# Patient Record
Sex: Male | Born: 2000 | Race: Black or African American | Hispanic: No | Marital: Single | Smoking: Current every day smoker
Health system: Southern US, Community
[De-identification: ages and names within clinical notes are randomized; demographics above are authoritative.]

## PROBLEM LIST (undated history)

## (undated) DIAGNOSIS — F32A Depression, unspecified: Secondary | ICD-10-CM

## (undated) DIAGNOSIS — F329 Major depressive disorder, single episode, unspecified: Secondary | ICD-10-CM

## (undated) DIAGNOSIS — F319 Bipolar disorder, unspecified: Secondary | ICD-10-CM

## (undated) HISTORY — PX: TONSILLECTOMY: SUR1361

---
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
## 2019-02-17 ENCOUNTER — Encounter (HOSPITAL_COMMUNITY): Payer: Self-pay

## 2019-02-17 ENCOUNTER — Emergency Department (HOSPITAL_COMMUNITY)
Admission: EM | Admit: 2019-02-17 | Discharge: 2019-02-17 | Disposition: A | Discharge status: Home / Self Care | Payer: Medicaid - Out of State | Attending: Emergency Medicine | Admitting: Emergency Medicine

## 2019-02-17 ENCOUNTER — Other Ambulatory Visit: Payer: Self-pay

## 2019-02-17 ENCOUNTER — Emergency Department (HOSPITAL_COMMUNITY): Payer: Medicaid - Out of State

## 2019-02-17 DIAGNOSIS — Z20822 Contact with and (suspected) exposure to covid-19: Secondary | ICD-10-CM | POA: Diagnosis not present

## 2019-02-17 DIAGNOSIS — Z0184 Encounter for antibody response examination: Secondary | ICD-10-CM | POA: Diagnosis not present

## 2019-02-17 DIAGNOSIS — R6883 Chills (without fever): Secondary | ICD-10-CM | POA: Diagnosis present

## 2019-02-17 DIAGNOSIS — R509 Fever, unspecified: Secondary | ICD-10-CM | POA: Insufficient documentation

## 2019-02-17 HISTORY — DX: Depression, unspecified: F32.A

## 2019-02-17 HISTORY — DX: Bipolar disorder, unspecified: F31.9

## 2019-02-17 LAB — URINALYSIS, ROUTINE W REFLEX MICROSCOPIC
Bilirubin Urine: NEGATIVE
Glucose, UA: NEGATIVE mg/dL
Hgb urine dipstick: NEGATIVE
Ketones, ur: NEGATIVE mg/dL
Leukocytes,Ua: NEGATIVE
Nitrite: NEGATIVE
Protein, ur: NEGATIVE mg/dL
Specific Gravity, Urine: 1.023 (ref 1.005–1.030)
pH: 8 (ref 5.0–8.0)

## 2019-02-17 LAB — COMPREHENSIVE METABOLIC PANEL
ALT: 22 U/L (ref 0–44)
AST: 20 U/L (ref 15–41)
Albumin: 4.3 g/dL (ref 3.5–5.0)
Alkaline Phosphatase: 67 U/L (ref 38–126)
Anion gap: 10 (ref 5–15)
BUN: 11 mg/dL (ref 6–20)
CO2: 24 mmol/L (ref 22–32)
Calcium: 9.1 mg/dL (ref 8.9–10.3)
Chloride: 104 mmol/L (ref 98–111)
Creatinine, Ser: 0.92 mg/dL (ref 0.61–1.24)
GFR calc Af Amer: >60 mL/min (ref >60)
GFR calc non Af Amer: >60 mL/min (ref >60)
Glucose, Bld: 99 mg/dL (ref 70–99)
Potassium: 3.4 mmol/L — ABNORMAL LOW (ref 3.5–5.1)
Sodium: 138 mmol/L (ref 135–145)
Total Bilirubin: 1.3 mg/dL — ABNORMAL HIGH (ref 0.3–1.2)
Total Protein: 7.2 g/dL (ref 6.5–8.1)

## 2019-02-17 LAB — CBC WITH DIFFERENTIAL/PLATELET
Abs Immature Granulocytes: 0.04 10*3/uL (ref 0.00–0.07)
Basophils Absolute: 0 10*3/uL (ref 0.0–0.1)
Basophils Relative: 0 %
Eosinophils Absolute: 0 10*3/uL (ref 0.0–0.5)
Eosinophils Relative: 0 %
HCT: 46.4 % (ref 39.0–52.0)
Hemoglobin: 15 g/dL (ref 13.0–17.0)
Immature Granulocytes: 0 %
Lymphocytes Relative: 6 %
Lymphs Abs: 0.6 10*3/uL — ABNORMAL LOW (ref 0.7–4.0)
MCH: 26.4 pg (ref 26.0–34.0)
MCHC: 32.3 g/dL (ref 30.0–36.0)
MCV: 81.5 fL (ref 80.0–100.0)
Monocytes Absolute: 1 10*3/uL (ref 0.1–1.0)
Monocytes Relative: 9 %
Neutro Abs: 9.6 10*3/uL — ABNORMAL HIGH (ref 1.7–7.7)
Neutrophils Relative %: 85 %
Platelets: 225 10*3/uL (ref 150–400)
RBC: 5.69 MIL/uL (ref 4.22–5.81)
RDW: 13.4 % (ref 11.5–15.5)
WBC: 11.3 10*3/uL — ABNORMAL HIGH (ref 4.0–10.5)
nRBC: 0 % (ref 0.0–0.2)

## 2019-02-17 LAB — RESPIRATORY PANEL BY RT PCR (FLU A&B, COVID)
Influenza A by PCR: NEGATIVE
Influenza B by PCR: NEGATIVE
SARS Coronavirus 2 by RT PCR: NEGATIVE

## 2019-02-17 LAB — POC SARS CORONAVIRUS 2 AG -  ED: SARS Coronavirus 2 Ag: NEGATIVE

## 2019-02-17 MED ORDER — SODIUM CHLORIDE 0.9 % IV BOLUS
1000.0000 mL | Freq: Once | INTRAVENOUS | Status: AC
  Administered 2019-02-17: 1000 mL via INTRAVENOUS

## 2019-02-17 MED ORDER — IBUPROFEN 400 MG PO TABS
600.0000 mg | ORAL_TABLET | Freq: Once | ORAL | Status: AC
  Administered 2019-02-17: 07:00:00 600 mg via ORAL
  Filled 2019-02-17: qty 2

## 2019-02-17 MED ORDER — ACETAMINOPHEN 325 MG PO TABS
650.0000 mg | ORAL_TABLET | Freq: Once | ORAL | Status: AC
  Administered 2019-02-17: 650 mg via ORAL
  Filled 2019-02-17: qty 2

## 2019-02-17 NOTE — ED Notes (Signed)
Pt in restroom to have bm

## 2019-02-17 NOTE — Discharge Instructions (Addendum)
Drink plenty of fluids, when you have fever you need to drink extra.  Take ibuprofen 600 mg plus acetaminophen 1000 mg every 6 hours as needed for your fevers.  Return to the emergency department if you get worse such as sore throat, struggling to breathe, vomiting or any other symptom that seems worse.

## 2019-02-17 NOTE — ED Triage Notes (Addendum)
Pt arrives from [facility]. Pt recently moved here from [place], [place]. Pt states that he is here mainly for back pain but has a list of other complaints. Pts family told EMS that he has hx of anxiety and depression and recently had a medication change. Family wanted him to come to be "checked out".

## 2019-02-17 NOTE — ED Provider Notes (Signed)
[facility] EMERGENCY DEPARTMENT Provider Note   CSN: [id number] Arrival date & time: 02/17/19  0349   Time seen 4:27 AM  History No chief complaint on file.   Colin Marsh is a 19 y.o. male.  HPI   Patient states yesterday he started having cold chills.  He states when he went to the bathroom once yesterday saw some blood in his urine but not since.  He has had nausea and has vomited once.  He denies diarrhea.  He complains of lots of myalgias and a mild cough that is nonproductive.  He denies sore throat or rhinorrhea.  He complains of pain in his lower chest bilaterally.  He states movement makes the pain worse.  He also complains of right-sided flank pain that he has had "all my life".  He states he has tried to have it evaluated before but was told "we cannot help you".  He denies being around anybody who is sick or being around anybody with Covid.  PCP Patient, No Pcp Per   Past Medical History:  Diagnosis Date  . Bipolar 1 disorder ([facility])   . Depression     There are no problems to display for this patient.   History reviewed. No pertinent surgical history.     No family history on file.  Social History   Tobacco Use  . Smoking status: Not on file  Substance Use Topics  . Alcohol use: Not on file  . Drug use: Not on file  Works at the [employer] Patient is trying to quit smoking, he states 1 pack will last him a few weeks Patient states he quit drinking Patient moved here from [facility] recently to stay with his grandparents  Home Medications Prior to Admission medications   Not on File  BuSpar and Celexa  Allergies    Patient has no allergy information on record.  Review of Systems   Review of Systems  All other systems reviewed and are negative.   Physical Exam Updated Vital Signs BP (!) 102/49   Pulse 98   Temp (!) 101 F (38.3 C)   Resp 20   SpO2 97%   Physical Exam Vitals and nursing note reviewed.  Constitutional:    General: He is not in acute distress.    Appearance: Normal appearance. He is well-developed. He is not ill-appearing or toxic-appearing.  HENT:     Head: Normocephalic and atraumatic.     Right Ear: External ear normal.     Left Ear: External ear normal.     Nose: Nose normal. No mucosal edema or rhinorrhea.     Mouth/Throat:     Dentition: No dental abscesses.     Pharynx: No uvula swelling.  Eyes:     Extraocular Movements: Extraocular movements intact.     Conjunctiva/sclera: Conjunctivae normal.     Pupils: Pupils are equal, round, and reactive to light.  Cardiovascular:     Rate and Rhythm: Normal rate and regular rhythm.     Heart sounds: Normal heart sounds. No murmur. No friction rub. No gallop.   Pulmonary:     Effort: Pulmonary effort is normal. No respiratory distress.     Breath sounds: Normal breath sounds. No wheezing, rhonchi or rales.  Chest:     Chest wall: No tenderness or crepitus.  Abdominal:     General: Bowel sounds are normal. There is no distension.     Palpations: Abdomen is soft.     Tenderness: There is no  abdominal tenderness. There is right CVA tenderness. There is no guarding or rebound.  Musculoskeletal:        General: No tenderness. Normal range of motion.     Cervical back: Full passive range of motion without pain, normal range of motion and neck supple.       Back:     Comments: Moves all extremities well.  Nontender lumbar spine.  Skin:    General: Skin is warm and dry.     Capillary Refill: Capillary refill takes less than 2 seconds.     Coloration: Skin is not pale.     Findings: No erythema or rash.  Neurological:     General: No focal deficit present.     Mental Status: He is alert and oriented to person, place, and time.     Cranial Nerves: No cranial nerve deficit.  Psychiatric:        Mood and Affect: Mood normal. Mood is not anxious.        Speech: Speech normal.        Behavior: Behavior normal.        Thought Content:  Thought content normal.     ED Results / Procedures / Treatments   Labs (all labs ordered are listed, but only abnormal results are displayed) Results for orders placed or performed during the hospital encounter of 02/17/19  Respiratory Panel by RT PCR (Flu A&B, Covid) - Nasopharyngeal Swab   Specimen: Nasopharyngeal Swab  Result Value Ref Range   SARS Coronavirus 2 by RT PCR NEGATIVE NEGATIVE   Influenza A by PCR NEGATIVE NEGATIVE   Influenza B by PCR NEGATIVE NEGATIVE  Urinalysis, Routine w reflex microscopic  Result Value Ref Range   Color, Urine YELLOW YELLOW   APPearance CLEAR CLEAR   Specific Gravity, Urine 1.023 1.005 - 1.030   pH 8.0 5.0 - 8.0   Glucose, UA NEGATIVE NEGATIVE mg/dL   Hgb urine dipstick NEGATIVE NEGATIVE   Bilirubin Urine NEGATIVE NEGATIVE   Ketones, ur NEGATIVE NEGATIVE mg/dL   Protein, ur NEGATIVE NEGATIVE mg/dL   Nitrite NEGATIVE NEGATIVE   Leukocytes,Ua NEGATIVE NEGATIVE  Comprehensive metabolic panel  Result Value Ref Range   Sodium 138 135 - 145 mmol/L   Potassium 3.4 (L) 3.5 - 5.1 mmol/L   Chloride 104 98 - 111 mmol/L   CO2 24 22 - 32 mmol/L   Glucose, Bld 99 70 - 99 mg/dL   BUN 11 6 - 20 mg/dL   Creatinine, Ser 0.92 0.61 - 1.24 mg/dL   Calcium 9.1 8.9 - 10.3 mg/dL   Total Protein 7.2 6.5 - 8.1 g/dL   Albumin 4.3 3.5 - 5.0 g/dL   AST 20 15 - 41 U/L   ALT 22 0 - 44 U/L   Alkaline Phosphatase 67 38 - 126 U/L   Total Bilirubin 1.3 (H) 0.3 - 1.2 mg/dL   GFR calc non Af Amer >60 >60 mL/min   GFR calc Af Amer >60 >60 mL/min   Anion gap 10 5 - 15  CBC with Differential  Result Value Ref Range   WBC 11.3 (H) 4.0 - 10.5 K/uL   RBC 5.69 4.22 - 5.81 MIL/uL   Hemoglobin 15.0 13.0 - 17.0 g/dL   HCT 46.4 39.0 - 52.0 %   MCV 81.5 80.0 - 100.0 fL   MCH 26.4 26.0 - 34.0 pg   MCHC 32.3 30.0 - 36.0 g/dL   RDW 13.4 11.5 - 15.5 %   Platelets 225 150 -  400 K/uL   nRBC 0.0 0.0 - 0.2 %   Neutrophils Relative % 85 %   Neutro Abs 9.6 (H) 1.7 - 7.7 K/uL     Lymphocytes Relative 6 %   Lymphs Abs 0.6 (L) 0.7 - 4.0 K/uL   Monocytes Relative 9 %   Monocytes Absolute 1.0 0.1 - 1.0 K/uL   Eosinophils Relative 0 %   Eosinophils Absolute 0.0 0.0 - 0.5 K/uL   Basophils Relative 0 %   Basophils Absolute 0.0 0.0 - 0.1 K/uL   Immature Granulocytes 0 %   Abs Immature Granulocytes 0.04 0.00 - 0.07 K/uL  POC SARS Coronavirus 2 Ag-ED - Nasal Swab (BD Veritor Kit)  Result Value Ref Range   SARS Coronavirus 2 Ag NEGATIVE NEGATIVE    Laboratory interpretation all normal except mild leukocytosis    EKG None  Radiology DG Chest Port 1 View  Result Date: 02/17/2019 CLINICAL DATA:  Fever mild cough EXAM: PORTABLE CHEST 1 VIEW COMPARISON:  None. FINDINGS: The heart size and mediastinal contours are within normal limits. Both lungs are clear. The visualized skeletal structures are unremarkable. IMPRESSION: No active disease. Electronically Signed   By: Prudencio Pair M.D.   On: 02/17/2019 05:21    Procedures Procedures (including critical care time)  Medications Ordered in ED Medications  sodium chloride 0.9 % bolus 1,000 mL (0 mLs Intravenous Stopped 02/17/19 0650)  acetaminophen (TYLENOL) tablet 650 mg (650 mg Oral Given 02/17/19 0636)  ibuprofen (ADVIL) tablet 600 mg (600 mg Oral Given 02/17/19 0636)  sodium chloride 0.9 % bolus 1,000 mL (1,000 mLs Intravenous New Bag/Given (Non-Interop) 02/17/19 2831)    ED Course  I have reviewed the triage vital signs and the nursing notes.  Pertinent labs & imaging results that were available during my care of the patient were reviewed by me and considered in my medical decision making (see chart for details).    MDM Rules/Calculators/A&P                      Patient was tested for Covid and  for influenza and are negative.  Chest x-ray was done and was normal.  His urinalysis was normal without signs of UTI.  Patient denies any sore throat.  His white blood cell count is mildly elevated at 11,000.  Patient  denies any IV drug use however.  When I look at his arms there is no track marks.  I checked his legs looking for cellulitis or any other skin infections which he does not have.  The pain he has in his back is not in the midline lumbar spine but over the top of the iliac crest.  States he has had x-rays in the past that were normal.  I had seen the patient walking to the bathroom and although he had a cane he was using his legs well and was not limping and did not appear to be weak.  Patient had gotten a liter of fluid bolus.  7:10 AM patient states he is feeling better and he feels ready to be discharged.  At this point the source of his fever is not clear, he may have a viral infection however his white blood cell count is mildly elevated.  Patient can return if he gets worse.  Final Clinical Impression(s) / ED Diagnoses Final diagnoses:  Fever, unspecified fever cause    Rx / DC Orders ED Discharge Orders    None    OTC ibuprofen and acetaminophen  Plan discharge  Rolland Porter, MD, Barbette Or, MD 02/17/19 [phone]

## 2019-02-18 LAB — URINE CULTURE
Culture: <10000 — AB
Special Requests: NORMAL

## 2019-02-22 LAB — CULTURE, BLOOD (ROUTINE X 2)
Culture: NO GROWTH
Culture: NO GROWTH
Special Requests: ADEQUATE
Special Requests: ADEQUATE

## 2019-05-05 ENCOUNTER — Encounter (HOSPITAL_COMMUNITY): Payer: Self-pay | Admitting: Emergency Medicine

## 2019-05-05 ENCOUNTER — Emergency Department (HOSPITAL_COMMUNITY): Payer: Medicaid - Out of State

## 2019-05-05 ENCOUNTER — Emergency Department (HOSPITAL_COMMUNITY)
Admission: EM | Admit: 2019-05-05 | Discharge: 2019-05-06 | Disposition: A | Discharge status: Home / Self Care | Payer: Medicaid - Out of State | Attending: Emergency Medicine | Admitting: Emergency Medicine

## 2019-05-05 ENCOUNTER — Other Ambulatory Visit: Payer: Self-pay

## 2019-05-05 DIAGNOSIS — M791 Myalgia, unspecified site: Secondary | ICD-10-CM | POA: Insufficient documentation

## 2019-05-05 DIAGNOSIS — Z79899 Other long term (current) drug therapy: Secondary | ICD-10-CM | POA: Insufficient documentation

## 2019-05-05 DIAGNOSIS — R569 Unspecified convulsions: Secondary | ICD-10-CM

## 2019-05-05 DIAGNOSIS — R55 Syncope and collapse: Secondary | ICD-10-CM | POA: Diagnosis not present

## 2019-05-05 DIAGNOSIS — F17210 Nicotine dependence, cigarettes, uncomplicated: Secondary | ICD-10-CM | POA: Diagnosis not present

## 2019-05-05 LAB — COMPREHENSIVE METABOLIC PANEL
ALT: 17 U/L (ref 0–44)
AST: 19 U/L (ref 15–41)
Albumin: 4.2 g/dL (ref 3.5–5.0)
Alkaline Phosphatase: 74 U/L (ref 38–126)
Anion gap: 7 (ref 5–15)
BUN: 9 mg/dL (ref 6–20)
CO2: 25 mmol/L (ref 22–32)
Calcium: 9.3 mg/dL (ref 8.9–10.3)
Chloride: 106 mmol/L (ref 98–111)
Creatinine, Ser: 0.93 mg/dL (ref 0.61–1.24)
GFR calc Af Amer: >60 mL/min (ref >60)
GFR calc non Af Amer: >60 mL/min (ref >60)
Glucose, Bld: 134 mg/dL — ABNORMAL HIGH (ref 70–99)
Potassium: 3.8 mmol/L (ref 3.5–5.1)
Sodium: 138 mmol/L (ref 135–145)
Total Bilirubin: 0.9 mg/dL (ref 0.3–1.2)
Total Protein: 7 g/dL (ref 6.5–8.1)

## 2019-05-05 LAB — CBC WITH DIFFERENTIAL/PLATELET
Abs Immature Granulocytes: 0.06 10*3/uL (ref 0.00–0.07)
Basophils Absolute: 0.1 10*3/uL (ref 0.0–0.1)
Basophils Relative: 1 %
Eosinophils Absolute: 0.1 10*3/uL (ref 0.0–0.5)
Eosinophils Relative: 1 %
HCT: 46 % (ref 39.0–52.0)
Hemoglobin: 14.9 g/dL (ref 13.0–17.0)
Immature Granulocytes: 1 %
Lymphocytes Relative: 9 %
Lymphs Abs: 1.1 10*3/uL (ref 0.7–4.0)
MCH: 26.8 pg (ref 26.0–34.0)
MCHC: 32.4 g/dL (ref 30.0–36.0)
MCV: 82.9 fL (ref 80.0–100.0)
Monocytes Absolute: 1 10*3/uL (ref 0.1–1.0)
Monocytes Relative: 8 %
Neutro Abs: 10.1 10*3/uL — ABNORMAL HIGH (ref 1.7–7.7)
Neutrophils Relative %: 80 %
Platelets: 256 10*3/uL (ref 150–400)
RBC: 5.55 MIL/uL (ref 4.22–5.81)
RDW: 14.3 % (ref 11.5–15.5)
WBC: 12.4 10*3/uL — ABNORMAL HIGH (ref 4.0–10.5)
nRBC: 0 % (ref 0.0–0.2)

## 2019-05-05 LAB — ETHANOL: Alcohol, Ethyl (B): <10 mg/dL (ref <10)

## 2019-05-05 LAB — CK: Total CK: 268 U/L (ref 49–397)

## 2019-05-05 LAB — LACTIC ACID, PLASMA: Lactic Acid, Venous: 1.3 mmol/L (ref 0.5–1.9)

## 2019-05-05 MED ORDER — SODIUM CHLORIDE 0.9 % IV BOLUS
1000.0000 mL | Freq: Once | INTRAVENOUS | Status: AC
  Administered 2019-05-05: 1000 mL via INTRAVENOUS

## 2019-05-05 NOTE — ED Triage Notes (Signed)
Pt states that he "thinks he had a seizure. I couldn't see or hear anything." Seizure was unwitnessed.

## 2019-05-05 NOTE — ED Notes (Signed)
Pt states that he "was at [facility] earlier today for seizures and EMS put me in waiting room. I sat there for 2 hours and kept getting worse and people kept being seen before me so I left."

## 2019-05-05 NOTE — ED Notes (Addendum)
Pt's significant other came out to desk saying pt was having another seizure. When this nurse went into room pt was holding onto rail and would not respond to voice. Pt opened eyes on command and shook his head no when asked his name, if he knew his name and if he could squeeze fingers. Significant other (works as EMT at [employer]) raised pt hand over his head and he (patient) held hand in place.

## 2019-05-05 NOTE — ED Provider Notes (Signed)
[facility] EMERGENCY DEPARTMENT Provider Note   CSN: [id number] Arrival date & time: 05/05/19  2149     History Chief Complaint  Patient presents with  . Loss of Consciousness    (possible seizure)    Colin Marsh is a 19 y.o. male.  Patient brought in by family members with questionable seizure activity.  Patient has a history of bipolar disorder and depression.  States he takes Lexapro and 2 other medications that he does not know.  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  He began to feel dizzy and lightheaded and everything went black.  He states he woke up with people surrounding him and did not know what happened.  He wonders if he had a seizure.  He denies any preceding chest pain or shortness of breath.  He states he did feel a bit lightheaded and dizzy and nauseated.  He has had seizures in the past but not for 2 years.  States he was seen by a neurologist and EEG and other testing but was never placed on seizure medications because "they could not figure out what was causing it".  He denies any tongue biting or incontinence tonight.  Denies any illicit drug use.  States he hurts all over.  Shortly after he arrived to the ED he had a witnessed episode of seizure-like activity by nursing staff.  He was holding onto the rails of the bed and would not respond to voice.  He will did however open his eyes to command and could not follow commands.  His significant other raise his arm above his head which he held in place.  Patient states he had neurological testing in [place] about 2 or 3 years ago.  He states he saw a neurologist and was told he did not need to be on any seizure medications.  He believes he had an MRI but is not sure what other testing he had.  Patient gave permission to speak with his father Colin Marsh [phone].  There was no answer. Patient also gave permission to speak with his grandmother Colin Marsh.   [phone]  Discussed with patient's grandmother Colin Marsh.  She states patient has a "bad attitude" and mental health problems.  As far she knows he does not take any medications for seizures and she is never seen him have a seizure.  She does not know what work-up he has had for seizures.  She does not know if he uses any drugs.  The history is provided by the patient.  Loss of Consciousness Associated symptoms: dizziness, seizures and weakness   Associated symptoms: no chest pain, no fever, no headaches, no nausea, no shortness of breath and no vomiting        Past Medical History:  Diagnosis Date  . Bipolar 1 disorder ([facility])   . Depression     There are no problems to display for this patient.   Past Surgical History:  Procedure Laterality Date  . TONSILLECTOMY         No family history on file.  Social History   Tobacco Use  . Smoking status: Current Every Day Smoker    Packs/day: 0.50  . Smokeless tobacco: Never Used  Substance Use Topics  . Alcohol use: Never  . Drug use: Never    Home Medications Prior to Admission medications   Not on File    Allergies    Onion  Review of Systems   Review of Systems  Constitutional: Negative for activity change, appetite change and fever.  HENT: Negative for congestion and rhinorrhea.   Respiratory: Negative for cough, chest tightness and shortness of breath.   Cardiovascular: Positive for syncope. Negative for chest pain.  Gastrointestinal: Negative for abdominal pain, nausea and vomiting.  Genitourinary: Negative for dysuria and hematuria.  Musculoskeletal: Positive for arthralgias and myalgias.  Skin: Negative for wound.  Neurological: Positive for dizziness, seizures, weakness and light-headedness. Negative for headaches.   all other systems are negative except as noted in the HPI and PMH.    Physical Exam Updated Vital Signs BP 134/64   Pulse 85   Temp 98.7 F (37.1 C)   Resp 18   Ht 6' (1.829 m)   Wt  86.2 kg   SpO2 98%   BMI 25.77 kg/m   Physical Exam Vitals and nursing note reviewed.  Constitutional:      General: He is not in acute distress.    Appearance: He is well-developed.     Comments: Flat affect, somewhat slow to respond  HENT:     Head: Normocephalic and atraumatic.     Mouth/Throat:     Pharynx: No oropharyngeal exudate.     Comments: No tongue biting Eyes:     Conjunctiva/sclera: Conjunctivae normal.     Pupils: Pupils are equal, round, and reactive to light.  Neck:     Comments: No meningismus. Cardiovascular:     Rate and Rhythm: Normal rate and regular rhythm.     Heart sounds: Normal heart sounds. No murmur.  Pulmonary:     Effort: Pulmonary effort is normal. No respiratory distress.     Breath sounds: Normal breath sounds.  Abdominal:     Palpations: Abdomen is soft.     Tenderness: There is no abdominal tenderness. There is no guarding or rebound.  Genitourinary:    Comments: No incontinence Musculoskeletal:        General: No tenderness. Normal range of motion.     Cervical back: Normal range of motion and neck supple.  Skin:    General: Skin is warm.  Neurological:     Mental Status: He is alert and oriented to person, place, and time.     Cranial Nerves: No cranial nerve deficit.     Motor: No abnormal muscle tone.     Coordination: Coordination normal.     Comments: No ataxia on finger to nose bilaterally. No pronator drift. 4/5 strength throughout. CN 2-12 intact.Equal grip strength. Sensation intact.  Difficulty raising legs off the bed bilaterally  Psychiatric:        Behavior: Behavior normal.     ED Results / Procedures / Treatments   Labs (all labs ordered are listed, but only abnormal results are displayed) Labs Reviewed  CBC WITH DIFFERENTIAL/PLATELET - Abnormal; Notable for the following components:      Result Value   WBC 12.4 (*)    Neutro Abs 10.1 (*)    All other components within normal limits  COMPREHENSIVE METABOLIC  PANEL - Abnormal; Notable for the following components:   Glucose, Bld 134 (*)    All other components within normal limits  ETHANOL  RAPID URINE DRUG SCREEN, HOSP PERFORMED  URINALYSIS, ROUTINE W REFLEX MICROSCOPIC  CK  LACTIC ACID, PLASMA  D-DIMER, QUANTITATIVE (NOT AT [facility])  TROPONIN I (HIGH SENSITIVITY)  TROPONIN I (HIGH SENSITIVITY)    EKG EKG Interpretation  Date/Time:  Monday May 05 2019 23:00:21 EDT Ventricular Rate:  59 PR Interval:  QRS Duration: 102 QT Interval:  386 QTC Calculation: 383 R Axis:   90 Text Interpretation: Sinus rhythm Atrial premature complex Consider right ventricular hypertrophy Probable left ventricular hypertrophy No previous ECGs available Confirmed by Glynn Octave [id number]) on 05/05/2019 11:49:27 PM   Radiology CT Head Wo Contrast  Result Date: 05/06/2019 CLINICAL DATA:  Seizure EXAM: CT HEAD WITHOUT CONTRAST TECHNIQUE: Contiguous axial images were obtained from the base of the skull through the vertex without intravenous contrast. COMPARISON:  None. FINDINGS: Brain: There is no mass, hemorrhage or extra-axial collection. The size and configuration of the ventricles and extra-axial CSF spaces are normal. The brain parenchyma is normal, without acute or chronic infarction. Vascular: No abnormal hyperdensity of the major intracranial arteries or dural venous sinuses. No intracranial atherosclerosis. Skull: The visualized skull base, calvarium and extracranial soft tissues are normal. Sinuses/Orbits: No fluid levels or advanced mucosal thickening of the visualized paranasal sinuses. No mastoid or middle ear effusion. The orbits are normal. IMPRESSION: Normal head CT. Electronically Signed   By: Deatra Robinson M.D.   On: 05/06/2019 00:22    Procedures Procedures (including critical care time)  Medications Ordered in ED Medications  sodium chloride 0.9 % bolus 1,000 mL (1,000 mLs Intravenous New Bag/Given 05/05/19 2318)    ED Course  I have  reviewed the triage vital signs and the nursing notes.  Pertinent labs & imaging results that were available during my care of the patient were reviewed by me and considered in my medical decision making (see chart for details).    MDM Rules/Calculators/A&P                     Questionable seizure activity.  Patient seems to be somewhat slow to respond and possibly postictal.  No tongue biting or incontinence.  Patient somewhat slow to respond with flat affect.  He is alert and oriented x3. No obvious neurological deficits. EKG sinus rhythm.  CT head is normal. No brugada or prolonged QT.  Labs are reassuring with normal electrolytes and negative alcohol level.  [facility] in [place] was contacted.  Patient states he had neurological work-up there in 2018 or 2019.  This hospital had no record of any work-up other than a ER visit for muscle strain.  Work-up is reassuring.  Patient appears back to baseline.  He is tolerating p.o. and ambulatory.  Labs are reassuring, drug screen is negative.  CT head is negative. Unable to contact patient's father. Drug screen negative.  Patient is not suicidal and homicidal. Unclear if this was a true seizure.  Suspect this was likely not a true seizure. may be pseudoseizure. Patient states he was told in the past he did not need seizure medication so we will hold on starting him at this point.  Will refer to neurology.  Patient instructed he should not drive or operate heavy machinery until cleared by his neurologist.  On recheck, patient is awake and alert.  He is tolerating p.o. and ambulatory.  He denies any pain.  No suicidal thoughts or homicidal thoughts.  He is agreeable to follow-up with neurology.  He agrees no driving or operating heavy machinery.  Return precautions discussed. Final Clinical Impression(s) / ED Diagnoses Final diagnoses:  Seizure-like activity [facility])    Rx / DC Orders ED Discharge Orders    None        Jabarie Pop, Jeannett Senior, MD 05/06/19 [phone]

## 2019-05-06 LAB — URINALYSIS, ROUTINE W REFLEX MICROSCOPIC
Bilirubin Urine: NEGATIVE
Glucose, UA: NEGATIVE mg/dL
Hgb urine dipstick: NEGATIVE
Ketones, ur: NEGATIVE mg/dL
Leukocytes,Ua: NEGATIVE
Nitrite: NEGATIVE
Protein, ur: NEGATIVE mg/dL
Specific Gravity, Urine: 1.025 (ref 1.005–1.030)
pH: 6 (ref 5.0–8.0)

## 2019-05-06 LAB — RAPID URINE DRUG SCREEN, HOSP PERFORMED
Amphetamines: NOT DETECTED
Barbiturates: NOT DETECTED
Benzodiazepines: NOT DETECTED
Cocaine: NOT DETECTED
Opiates: NOT DETECTED
Tetrahydrocannabinol: NOT DETECTED

## 2019-05-06 LAB — TROPONIN I (HIGH SENSITIVITY): Troponin I (High Sensitivity): <2 ng/L (ref <18)

## 2019-05-06 LAB — D-DIMER, QUANTITATIVE: D-Dimer, Quant: <0.27 ug/mL-FEU (ref 0.00–0.50)

## 2019-05-06 NOTE — ED Notes (Signed)
Pt ambulated in hall with no assistance.  

## 2019-05-06 NOTE — Discharge Instructions (Addendum)
As we discussed it is not clear whether you had a seizure.  You should follow-up with a neurologist.  Do not drive or operate heavy machinery until cleared by the neurologist.  Avoid alcohol or illicit drugs.  Keep yourself hydrated. Return to the ED if you develop new or worsening symptoms.

## 2021-08-19 IMAGING — CT CT HEAD W/O CM
3 series · 15 of 47 positions shown, 18 images · non-contrast
Comparison: None.

CLINICAL DATA: Seizure

EXAM:
CT HEAD WITHOUT CONTRAST
TECHNIQUE: Contiguous axial images were obtained from the base of the skull
through the vertex without intravenous contrast.

[Series 2: head w o · axial · 0.45mm/px · z∈[+10,+145]mm · 9 of 33 slices shown, 12 images]
[im 3/33  brain]
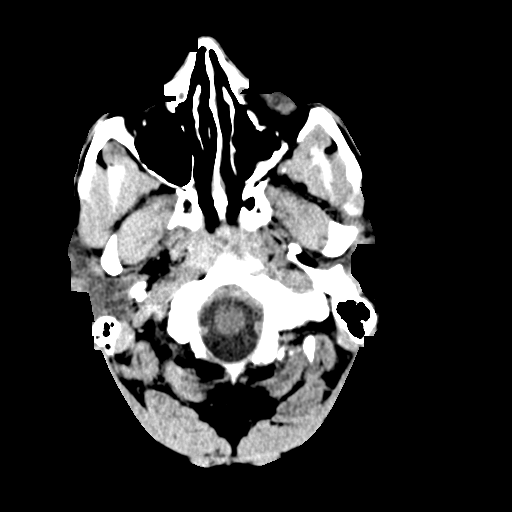
[im 3/33  bone]
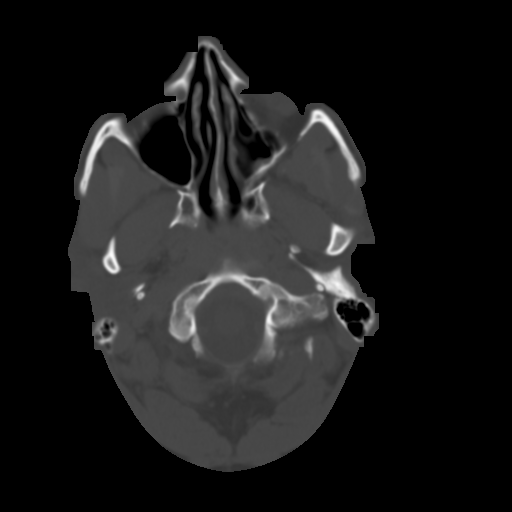
[im 6/33  brain]
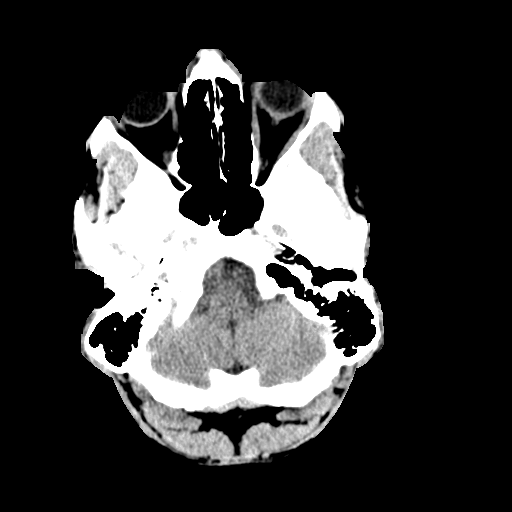
[im 9/33  brain]
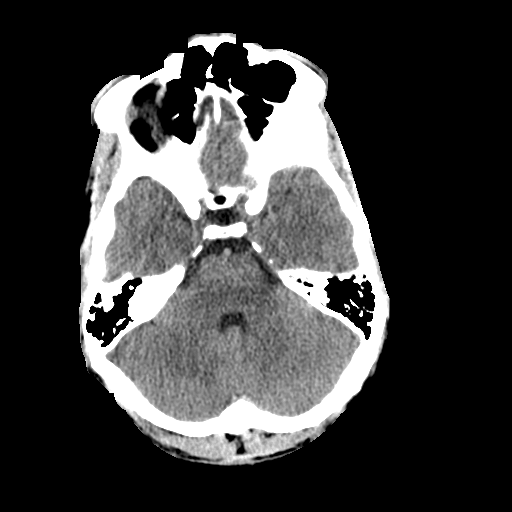
[im 13/33  brain]
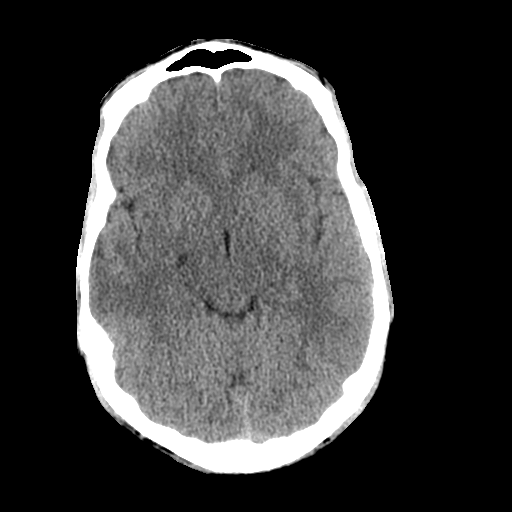
[im 17/33  brain]
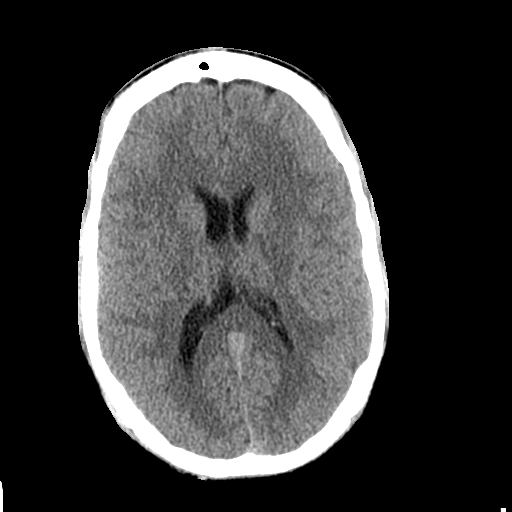
[im 17/33  bone]
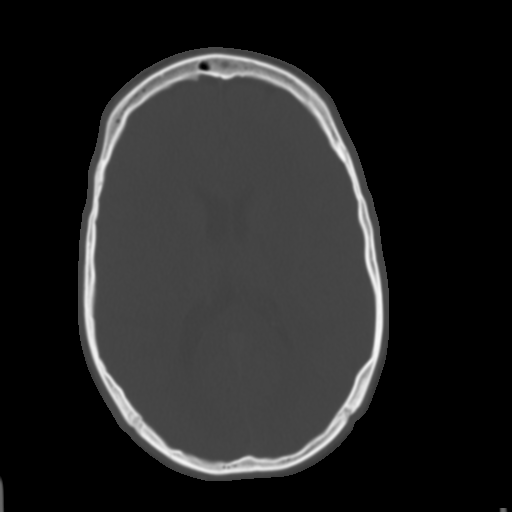
[im 20/33  brain]
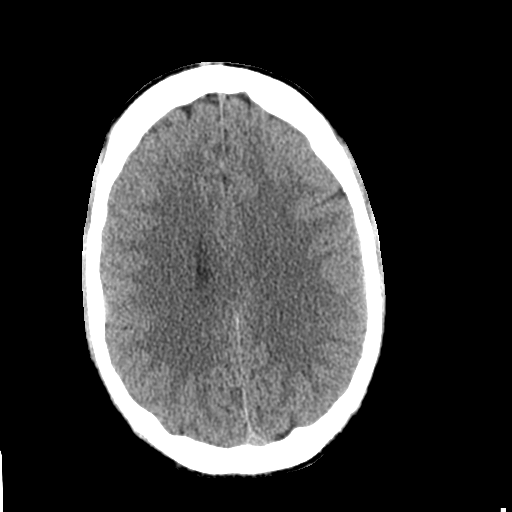
[im 24/33  brain]
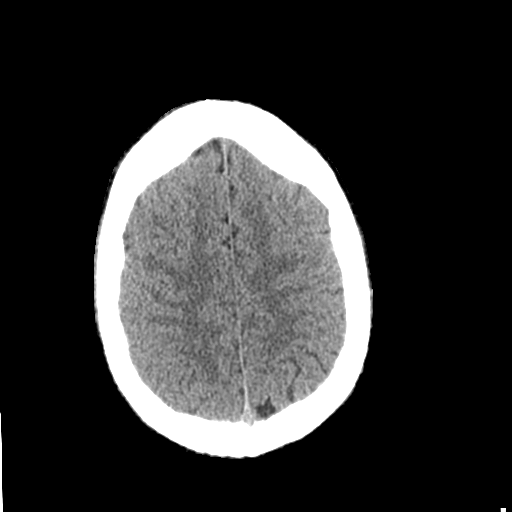
[im 27/33  brain]
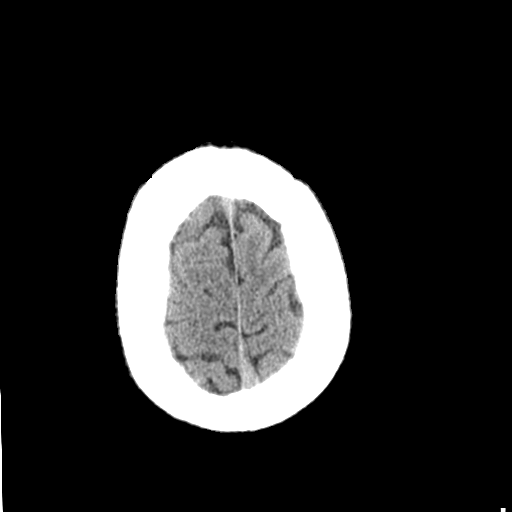
[im 30/33  brain]
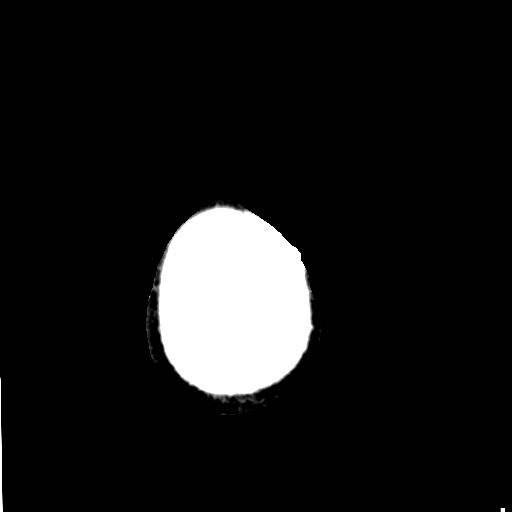
[im 30/33  bone]
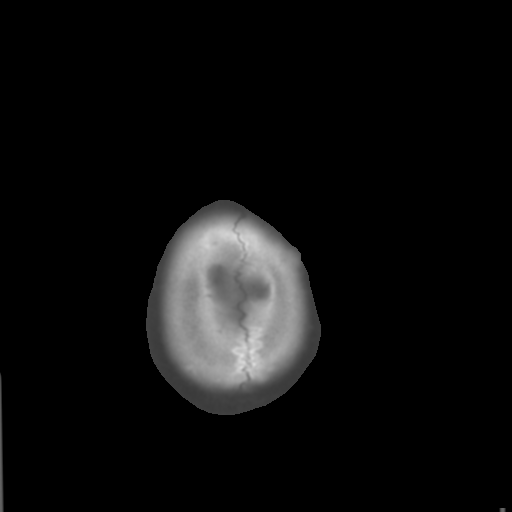

[Series 4: coronal soft · coronal · 0.33mm/px · 3 of 72 slices shown]
[im 24/72  brain]
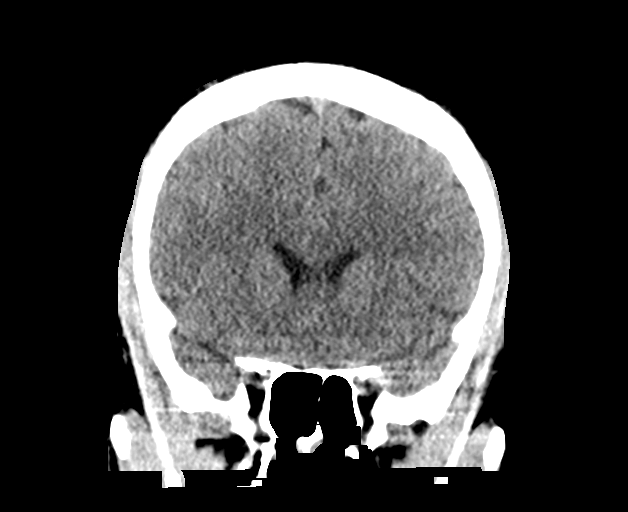
[im 32/72  brain]
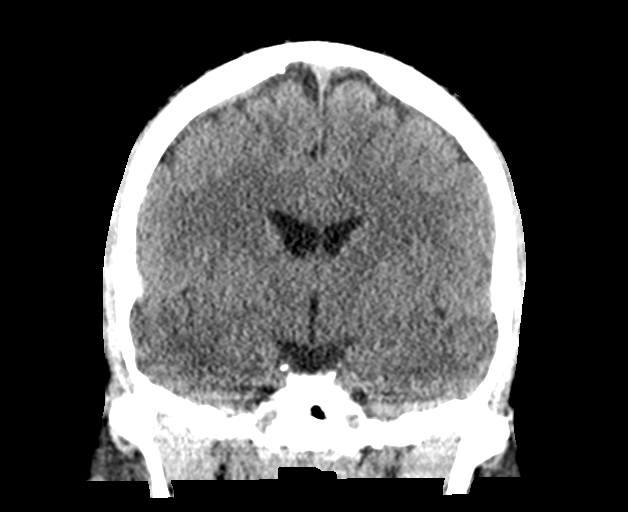
[im 40/72  brain]
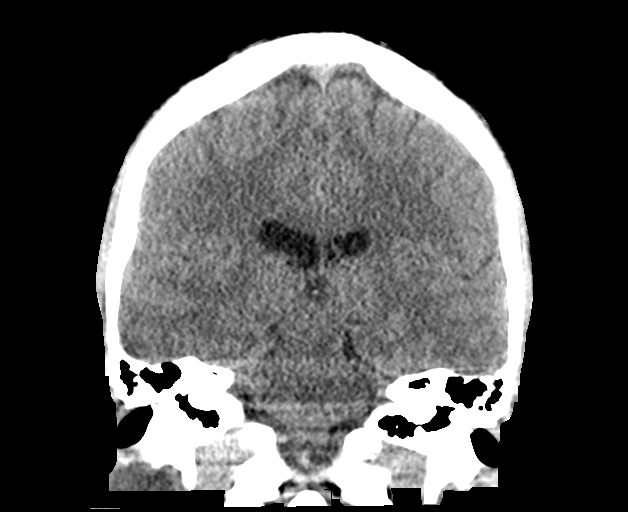

[Series 5: sagittal soft · sagittal · 0.33mm/px · 3 of 53 slices shown]
[im 18/53  brain]
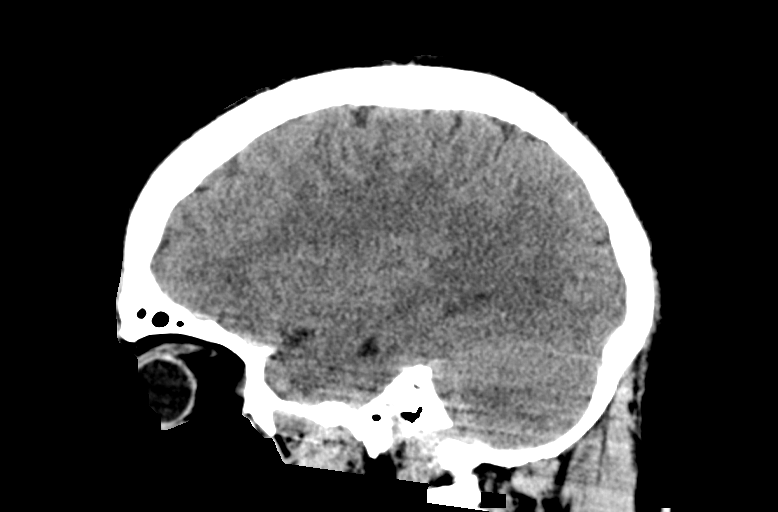
[im 27/53  brain]
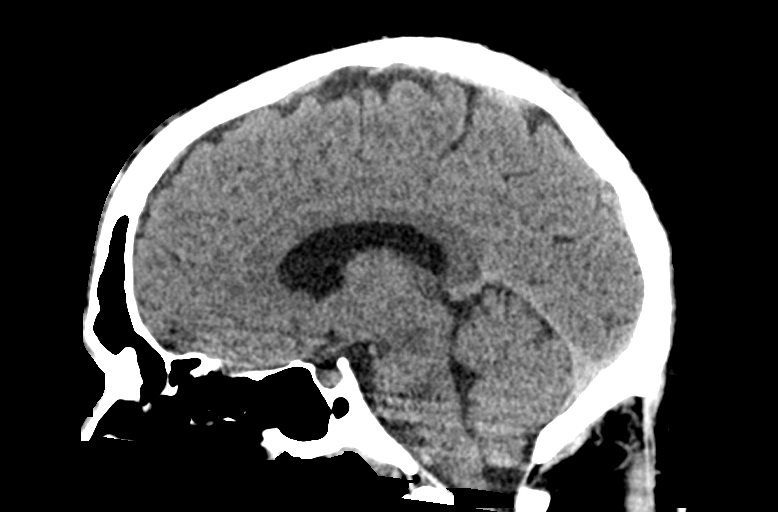
[im 35/53  brain]
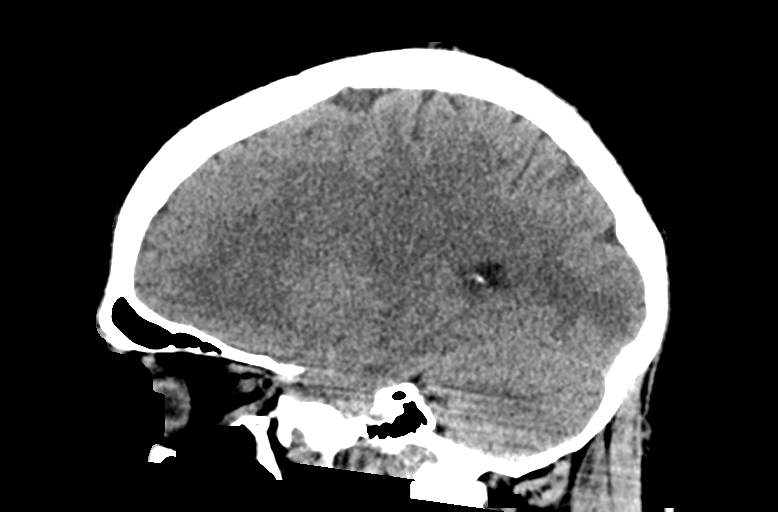

[15 of 47 positions shown; findings below may reference images not displayed]

FINDINGS: Brain: There is no mass, hemorrhage or extra-axial collection. The
size and configuration of the ventricles and extra-axial CSF spaces
are normal. The brain parenchyma is normal, without acute or chronic
infarction.

Vascular: No abnormal hyperdensity of the major intracranial
arteries or dural venous sinuses. No intracranial atherosclerosis.

Skull: The visualized skull base, calvarium and extracranial soft
tissues are normal.

Sinuses/Orbits: No fluid levels or advanced mucosal thickening of
the visualized paranasal sinuses. No mastoid or middle ear effusion.
The orbits are normal.
IMPRESSION: Normal head CT.
